# Patient Record
Sex: Female | Born: 2000 | Race: Black or African American | Hispanic: No | Marital: Single | Smoking: Never smoker
Health system: Southern US, Community
[De-identification: ages and names within clinical notes are randomized; demographics above are authoritative.]

---
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
## 2022-03-27 ENCOUNTER — Emergency Department (HOSPITAL_COMMUNITY)
Admission: EM | Admit: 2022-03-27 | Discharge: 2022-03-28 | Disposition: A | Discharge status: Home / Self Care | Payer: BC Managed Care – PPO | Attending: Emergency Medicine | Admitting: Emergency Medicine

## 2022-03-27 ENCOUNTER — Other Ambulatory Visit: Payer: Self-pay

## 2022-03-27 DIAGNOSIS — Z23 Encounter for immunization: Secondary | ICD-10-CM | POA: Diagnosis not present

## 2022-03-27 DIAGNOSIS — W268XXA Contact with other sharp object(s), not elsewhere classified, initial encounter: Secondary | ICD-10-CM | POA: Insufficient documentation

## 2022-03-27 DIAGNOSIS — S61412A Laceration without foreign body of left hand, initial encounter: Secondary | ICD-10-CM | POA: Diagnosis not present

## 2022-03-27 MED ORDER — TETANUS-DIPHTH-ACELL PERTUSSIS 5-2.5-18.5 LF-MCG/0.5 IM SUSY
0.5000 mL | PREFILLED_SYRINGE | Freq: Once | INTRAMUSCULAR | Status: AC
  Administered 2022-03-27: 0.5 mL via INTRAMUSCULAR
  Filled 2022-03-27: qty 0.5

## 2022-03-27 MED ORDER — LIDOCAINE HCL (PF) 1 % IJ SOLN
5.0000 mL | Freq: Once | INTRAMUSCULAR | Status: AC
  Administered 2022-03-27: 5 mL
  Filled 2022-03-27: qty 30

## 2022-03-27 MED ORDER — BACITRACIN ZINC 500 UNIT/GM EX OINT
TOPICAL_OINTMENT | Freq: Once | CUTANEOUS | Status: AC
  Filled 2022-03-27: qty 2.7

## 2022-03-27 NOTE — ED Provider Notes (Signed)
  [facility] Provider Note   CSN: [id number] Arrival date & time: 03/27/22  2230     History {Add pertinent medical, surgical, social history, OB history to HPI:1} Chief Complaint  Patient presents with   Laceration    Angel Yu is a 22 y.o. female.   Laceration      Home Medications Prior to Admission medications   Not on File      Allergies    Patient has no known allergies.    Review of Systems   Review of Systems  Physical Exam Updated Vital Signs BP 137/83 (BP Location: Right Arm)   Pulse 94   Temp 98.7 F (37.1 C) (Oral)   Resp 18   SpO2 100%  Physical Exam  ED Results / Procedures / Treatments   Labs (all labs ordered are listed, but only abnormal results are displayed) Labs Reviewed - No data to display  EKG None  Radiology No results found.  Procedures Procedures  {Document cardiac monitor, telemetry assessment procedure when appropriate:1}  Medications Ordered in ED Medications - No data to display  ED Course/ Medical Decision Making/ A&P   {   Click here for ABCD2, HEART and other calculatorsREFRESH Note before signing :1}                          Medical Decision Making  ***  {Document critical care time when appropriate:1} {Document review of labs and clinical decision tools ie heart score, Chads2Vasc2 etc:1}  {Document your independent review of radiology images, and any outside records:1} {Document your discussion with family members, caretakers, and with consultants:1} {Document social determinants of health affecting pt's care:1} {Document your decision making why or why not admission, treatments were needed:1} Final Clinical Impression(s) / ED Diagnoses Final diagnoses:  None    Rx / DC Orders ED Discharge Orders     None

## 2022-03-27 NOTE — ED Triage Notes (Signed)
Pt presents with laceration between index and middle finger of left hand.  Was cutting her hair with a razor and slipped.  This happened about an hour PTA.  Unknown last tetanus.

## 2022-03-27 NOTE — Discharge Instructions (Signed)
You were seen in the ER for a laceration to your hand. I have attached laceration care instructions. Please return in 7-10 days for removal of your sutures. Please return sooner if you have evidence of infection like pus or if the area is red, hot and swollen.

## 2022-03-27 NOTE — ED Provider Notes (Incomplete)
[facility] Provider Note   CSN: [id number] Arrival date & time: 03/27/22  2230     History {Add pertinent medical, surgical, social history, OB history to HPI:1} Chief Complaint  Patient presents with  . Laceration    Angel Yu is a 22 y.o. female.   Laceration      Home Medications Prior to Admission medications   Not on File      Allergies    Patient has no known allergies.    Review of Systems   Review of Systems  Physical Exam Updated Vital Signs BP 137/83 (BP Location: Right Arm)   Pulse 94   Temp 98.7 F (37.1 C) (Oral)   Resp 18   SpO2 100%  Physical Exam  ED Results / Procedures / Treatments   Labs (all labs ordered are listed, but only abnormal results are displayed) Labs Reviewed - No data to display  EKG None  Radiology No results found.  Procedures .Marland KitchenLaceration Repair  Date/Time: 03/27/2022 11:48 PM  Performed by: Mickie Hillier, PA-C Authorized by: Mickie Hillier, PA-C   Consent:    Consent obtained:  Verbal   Consent given by:  Patient   Risks, benefits, and alternatives were discussed: yes     Risks discussed:  Infection, pain, retained foreign body, need for additional repair, poor cosmetic result and poor wound healing   Alternatives discussed:  No treatment Universal protocol:    Procedure explained and questions answered to patient or proxy's satisfaction: yes     Relevant documents present and verified: yes     Test results available: yes     Imaging studies available: yes     Required blood products, implants, devices, and special equipment available: yes     Site/side marked: yes     Immediately prior to procedure, a time out was called: yes     Patient identity confirmed:  Verbally with patient Anesthesia:    Anesthesia method:  Local infiltration   Local anesthetic:  Lidocaine 1% w/o epi Laceration details:    Location:  Hand   Hand location:  R hand,  dorsum   Length (cm):  1   Depth (mm):  4 Pre-procedure details:    Preparation:  Patient was prepped and draped in usual sterile fashion Exploration:    Limited defect created (wound extended): no     Hemostasis achieved with:  Direct pressure   Wound exploration: wound explored through full range of motion and entire depth of wound visualized     Wound extent: areolar tissue violated     Contaminated: no   Treatment:    Area cleansed with:  Povidone-iodine and saline   Amount of cleaning:  Standard   Irrigation volume:  250   Irrigation method:  Pressure wash   Visualized foreign bodies/material removed: no     Debridement:  None   Undermining:  None Skin repair:    Repair method:  Sutures   Suture size:  5-0   Suture material:  Prolene   Suture technique:  Simple interrupted   Number of sutures:  3 Approximation:    Approximation:  Close Repair type:    Repair type:  Simple Post-procedure details:    Dressing:  Antibiotic ointment   Procedure completion:  Tolerated well, no immediate complications   {Document cardiac monitor, telemetry assessment procedure when appropriate:1}  Medications Ordered in ED Medications - No data to display  ED Course/ Medical Decision  Making/ A&P   {   Click here for ABCD2, HEART and other calculatorsREFRESH Note before signing :1}                          Medical Decision Making Risk Prescription drug management.   ***  {Document critical care time when appropriate:1} {Document review of labs and clinical decision tools ie heart score, Chads2Vasc2 etc:1}  {Document your independent review of radiology images, and any outside records:1} {Document your discussion with family members, caretakers, and with consultants:1} {Document social determinants of health affecting pt's care:1} {Document your decision making why or why not admission, treatments were needed:1} Final Clinical Impression(s) / ED Diagnoses Final diagnoses:  None     Rx / DC Orders ED Discharge Orders     None
